# Patient Record
Sex: Male | Born: 1975 | Race: White | Hispanic: No | Marital: Married | Smoking: Current every day smoker
Health system: Southern US, Community
[De-identification: ages and names within clinical notes are randomized; demographics above are authoritative.]

## PROBLEM LIST (undated history)

## (undated) DIAGNOSIS — R200 Anesthesia of skin: Secondary | ICD-10-CM

## (undated) DIAGNOSIS — R202 Paresthesia of skin: Secondary | ICD-10-CM

## (undated) HISTORY — DX: Paresthesia of skin: R20.2

## (undated) HISTORY — DX: Anesthesia of skin: R20.0

## (undated) HISTORY — PX: WISDOM TOOTH EXTRACTION: SHX21

---

## 2019-02-19 ENCOUNTER — Other Ambulatory Visit: Payer: Self-pay

## 2019-02-19 ENCOUNTER — Other Ambulatory Visit: Payer: Self-pay | Admitting: Orthopedic Surgery

## 2019-02-19 ENCOUNTER — Encounter (HOSPITAL_BASED_OUTPATIENT_CLINIC_OR_DEPARTMENT_OTHER): Payer: Self-pay | Admitting: *Deleted

## 2019-02-25 ENCOUNTER — Ambulatory Visit (HOSPITAL_BASED_OUTPATIENT_CLINIC_OR_DEPARTMENT_OTHER)
Admission: RE | Admit: 2019-02-25 | Discharge: 2019-02-25 | Disposition: A | Discharge status: Home / Self Care | Payer: BLUE CROSS/BLUE SHIELD | Attending: Orthopedic Surgery | Admitting: Orthopedic Surgery

## 2019-02-25 ENCOUNTER — Ambulatory Visit (HOSPITAL_BASED_OUTPATIENT_CLINIC_OR_DEPARTMENT_OTHER): Payer: BLUE CROSS/BLUE SHIELD | Admitting: Anesthesiology

## 2019-02-25 ENCOUNTER — Encounter (HOSPITAL_BASED_OUTPATIENT_CLINIC_OR_DEPARTMENT_OTHER)
Admission: RE | Disposition: A | Discharge status: Home / Self Care | Payer: Self-pay | Source: Home / Self Care | Attending: Orthopedic Surgery

## 2019-02-25 ENCOUNTER — Other Ambulatory Visit: Payer: Self-pay

## 2019-02-25 ENCOUNTER — Encounter (HOSPITAL_BASED_OUTPATIENT_CLINIC_OR_DEPARTMENT_OTHER): Payer: Self-pay | Admitting: *Deleted

## 2019-02-25 DIAGNOSIS — L720 Epidermal cyst: Secondary | ICD-10-CM | POA: Insufficient documentation

## 2019-02-25 DIAGNOSIS — F17200 Nicotine dependence, unspecified, uncomplicated: Secondary | ICD-10-CM | POA: Insufficient documentation

## 2019-02-25 DIAGNOSIS — R2232 Localized swelling, mass and lump, left upper limb: Secondary | ICD-10-CM | POA: Diagnosis present

## 2019-02-25 HISTORY — PX: MASS EXCISION: SHX2000

## 2019-02-25 SURGERY — EXCISION MASS
Anesthesia: Regional | Site: Finger | Laterality: Left

## 2019-02-25 MED ORDER — MIDAZOLAM HCL 2 MG/2ML IJ SOLN
1.0000 mg | INTRAMUSCULAR | Status: DC | PRN
  Administered 2019-02-25: 2 mg via INTRAVENOUS

## 2019-02-25 MED ORDER — PROPOFOL 500 MG/50ML IV EMUL
INTRAVENOUS | Status: DC | PRN
  Administered 2019-02-25: 25 ug/kg/min via INTRAVENOUS

## 2019-02-25 MED ORDER — FENTANYL CITRATE (PF) 100 MCG/2ML IJ SOLN
INTRAMUSCULAR | Status: AC
  Filled 2019-02-25: qty 2

## 2019-02-25 MED ORDER — FENTANYL CITRATE (PF) 100 MCG/2ML IJ SOLN
50.0000 ug | INTRAMUSCULAR | Status: DC | PRN
  Administered 2019-02-25: 50 ug via INTRAVENOUS

## 2019-02-25 MED ORDER — FENTANYL CITRATE (PF) 100 MCG/2ML IJ SOLN: 25.0000 ug | INTRAMUSCULAR | Status: DC | PRN

## 2019-02-25 MED ORDER — CHLORHEXIDINE GLUCONATE 4 % EX LIQD: 60.0000 mL | Freq: Once | CUTANEOUS | Status: DC

## 2019-02-25 MED ORDER — OXYCODONE HCL 5 MG/5ML PO SOLN: 5.0000 mg | Freq: Once | ORAL | Status: DC | PRN

## 2019-02-25 MED ORDER — LACTATED RINGERS IV SOLN
INTRAVENOUS | Status: DC
  Administered 2019-02-25: 11:00:00 via INTRAVENOUS

## 2019-02-25 MED ORDER — TRAMADOL HCL 50 MG PO TABS: 50.0000 mg | ORAL_TABLET | Freq: Four times a day (QID) | ORAL | 0 refills | Status: DC | PRN

## 2019-02-25 MED ORDER — CEFAZOLIN SODIUM-DEXTROSE 2-4 GM/100ML-% IV SOLN: 2.0000 g | INTRAVENOUS | Status: DC

## 2019-02-25 MED ORDER — CEFAZOLIN SODIUM-DEXTROSE 2-4 GM/100ML-% IV SOLN
INTRAVENOUS | Status: AC
  Filled 2019-02-25: qty 100

## 2019-02-25 MED ORDER — LIDOCAINE HCL (PF) 0.5 % IJ SOLN
INTRAMUSCULAR | Status: DC | PRN
  Administered 2019-02-25: 30 mL via INTRAVENOUS

## 2019-02-25 MED ORDER — ONDANSETRON HCL 4 MG/2ML IJ SOLN
INTRAMUSCULAR | Status: AC
  Filled 2019-02-25: qty 2

## 2019-02-25 MED ORDER — BUPIVACAINE HCL (PF) 0.25 % IJ SOLN
INTRAMUSCULAR | Status: DC | PRN
  Administered 2019-02-25: 7 mL

## 2019-02-25 MED ORDER — OXYCODONE HCL 5 MG PO TABS: 5.0000 mg | ORAL_TABLET | Freq: Once | ORAL | Status: DC | PRN

## 2019-02-25 MED ORDER — ONDANSETRON HCL 4 MG/2ML IJ SOLN: 4.0000 mg | Freq: Once | INTRAMUSCULAR | Status: DC | PRN

## 2019-02-25 MED ORDER — SCOPOLAMINE 1 MG/3DAYS TD PT72: 1.0000 | MEDICATED_PATCH | Freq: Once | TRANSDERMAL | Status: DC | PRN

## 2019-02-25 MED ORDER — MIDAZOLAM HCL 2 MG/2ML IJ SOLN
INTRAMUSCULAR | Status: AC
  Filled 2019-02-25: qty 2

## 2019-02-25 SURGICAL SUPPLY — 46 items
BLADE SURG 15 STRL LF DISP TIS (BLADE) ×1 IMPLANT
BLADE SURG 15 STRL SS (BLADE) ×2
BNDG COHESIVE 1X5 TAN STRL LF (GAUZE/BANDAGES/DRESSINGS) ×2 IMPLANT
BNDG COHESIVE 2X5 TAN STRL LF (GAUZE/BANDAGES/DRESSINGS) IMPLANT
BNDG COHESIVE 3X5 TAN STRL LF (GAUZE/BANDAGES/DRESSINGS) ×2 IMPLANT
BNDG ESMARK 4X9 LF (GAUZE/BANDAGES/DRESSINGS) ×2 IMPLANT
BNDG GAUZE ELAST 4 BULKY (GAUZE/BANDAGES/DRESSINGS) ×2 IMPLANT
CHLORAPREP W/TINT 26ML (MISCELLANEOUS) ×3 IMPLANT
CORD BIPOLAR FORCEPS 12FT (ELECTRODE) ×1 IMPLANT
COVER BACK TABLE 60X90IN (DRAPES) ×3 IMPLANT
COVER MAYO STAND STRL (DRAPES) ×3 IMPLANT
COVER WAND RF STERILE (DRAPES) IMPLANT
CUFF TOURNIQUET SINGLE 18IN (TOURNIQUET CUFF) ×2 IMPLANT
DECANTER SPIKE VIAL GLASS SM (MISCELLANEOUS) IMPLANT
DRAIN PENROSE 1/2X12 LTX STRL (WOUND CARE) IMPLANT
DRAPE EXTREMITY T 121X128X90 (DISPOSABLE) ×3 IMPLANT
DRAPE SURG 17X23 STRL (DRAPES) ×3 IMPLANT
GAUZE SPONGE 4X4 12PLY STRL (GAUZE/BANDAGES/DRESSINGS) ×3 IMPLANT
GAUZE XEROFORM 1X8 LF (GAUZE/BANDAGES/DRESSINGS) ×3 IMPLANT
GLOVE BIO SURGEON STRL SZ 6.5 (GLOVE) ×1 IMPLANT
GLOVE BIO SURGEONS STRL SZ 6.5 (GLOVE) ×1
GLOVE BIOGEL PI IND STRL 7.0 (GLOVE) IMPLANT
GLOVE BIOGEL PI IND STRL 8.5 (GLOVE) ×1 IMPLANT
GLOVE BIOGEL PI INDICATOR 7.0 (GLOVE) ×2
GLOVE BIOGEL PI INDICATOR 8.5 (GLOVE) ×2
GLOVE SURG ORTHO 8.0 STRL STRW (GLOVE) ×3 IMPLANT
GOWN STRL REUS W/ TWL LRG LVL3 (GOWN DISPOSABLE) ×1 IMPLANT
GOWN STRL REUS W/TWL LRG LVL3 (GOWN DISPOSABLE) ×2
GOWN STRL REUS W/TWL XL LVL3 (GOWN DISPOSABLE) ×3 IMPLANT
NDL PRECISIONGLIDE 27X1.5 (NEEDLE) ×1 IMPLANT
NDL SAFETY ECLIPSE 18X1.5 (NEEDLE) IMPLANT
NEEDLE HYPO 18GX1.5 SHARP (NEEDLE)
NEEDLE PRECISIONGLIDE 27X1.5 (NEEDLE) ×3 IMPLANT
NS IRRIG 1000ML POUR BTL (IV SOLUTION) ×3 IMPLANT
PACK BASIN DAY SURGERY FS (CUSTOM PROCEDURE TRAY) ×3 IMPLANT
PAD CAST 3X4 CTTN HI CHSV (CAST SUPPLIES) IMPLANT
PADDING CAST COTTON 3X4 STRL (CAST SUPPLIES) ×2
SPLINT PLASTER CAST XFAST 3X15 (CAST SUPPLIES) IMPLANT
SPLINT PLASTER XTRA FASTSET 3X (CAST SUPPLIES) ×10
STOCKINETTE 4X48 STRL (DRAPES) ×3 IMPLANT
SUT ETHILON 4 0 PS 2 18 (SUTURE) ×3 IMPLANT
SUT VIC AB 4-0 P2 18 (SUTURE) IMPLANT
SYR BULB 3OZ (MISCELLANEOUS) ×3 IMPLANT
SYR CONTROL 10ML LL (SYRINGE) ×3 IMPLANT
TOWEL GREEN STERILE FF (TOWEL DISPOSABLE) ×3 IMPLANT
UNDERPAD 30X30 (UNDERPADS AND DIAPERS) ×1 IMPLANT

## 2019-02-25 NOTE — Anesthesia Postprocedure Evaluation (Signed)
Anesthesia Post Note  Patient: Sora Urgiles  Procedure(s) Performed: EXCISION MASS LEFT SMALL FINGER (Left Finger)     Patient location during evaluation: PACU Anesthesia Type: Bier Block Level of consciousness: awake and alert Pain management: pain level controlled Vital Signs Assessment: post-procedure vital signs reviewed and stable Respiratory status: spontaneous breathing, nonlabored ventilation and respiratory function stable Cardiovascular status: blood pressure returned to baseline and stable Postop Assessment: no apparent nausea or vomiting Anesthetic complications: no    Last Vitals:  Vitals:   02/25/19 1156 02/25/19 1157  BP:    Pulse: 80 82  Resp: 17 17  Temp:    SpO2: 96% 96%    Last Pain:  Vitals:   02/25/19 1139  TempSrc:   PainSc: 4                  Lucretia Kern

## 2019-02-25 NOTE — Anesthesia Preprocedure Evaluation (Signed)
Anesthesia Evaluation  Patient identified by MRN, date of birth, ID band Patient awake    Reviewed: Allergy & Precautions, NPO status , Patient's Chart, lab work & pertinent test results  History of Anesthesia Complications Negative for: history of anesthetic complications  Airway Mallampati: II  TM Distance: >3 FB Neck ROM: Full    Dental no notable dental hx.    Pulmonary Current Smoker,    Pulmonary exam normal        Cardiovascular negative cardio ROS Normal cardiovascular exam     Neuro/Psych negative neurological ROS  negative psych ROS   GI/Hepatic negative GI ROS, Neg liver ROS,   Endo/Other  negative endocrine ROS  Renal/GU negative Renal ROS  negative genitourinary   Musculoskeletal negative musculoskeletal ROS (+)   Abdominal   Peds  Hematology negative hematology ROS (+)   Anesthesia Other Findings   Reproductive/Obstetrics                             Anesthesia Physical Anesthesia Plan  ASA: II  Anesthesia Plan: Bier Block and Bier Block-LIDOCAINE ONLY   Post-op Pain Management:    Induction:   PONV Risk Score and Plan: 0 and Propofol infusion and Treatment may vary due to age or medical condition  Airway Management Planned: Nasal Cannula and Simple Face Mask  Additional Equipment: None  Intra-op Plan:   Post-operative Plan:   Informed Consent: I have reviewed the patients History and Physical, chart, labs and discussed the procedure including the risks, benefits and alternatives for the proposed anesthesia with the patient or authorized representative who has indicated his/her understanding and acceptance.       Plan Discussed with:   Anesthesia Plan Comments:         Anesthesia Quick Evaluation

## 2019-02-25 NOTE — H&P (Signed)
Clifford Wright is an 43 y.o. male.   Chief Complaint: mass left small finger HPI: Clifford Wright is a 43 year old right-hand-dominant male comes in with a complaint of a mass on his left small finger which is been present for approximately 22 months. He is referred by internal medicine. He has recently moved here from [place]. He had it looked at therapy where they tried to aspirate it without any efficacy any nothing. He was told he had a cyst. He states he is not having any pain with it and has enlarged nodes first being noted. Is on the volar radial aspect of the proximal phalanx at the webspace of his small finger left hand. He has no prior history of injury. He has not complained of any pain or discomfort. It has enlarged. He has no history of diabetes thyroid problems arthritis or gout. History is negative for each of these.   History reviewed. No pertinent past medical history.  Past Surgical History:  Procedure Laterality Date  . WISDOM TOOTH EXTRACTION      History reviewed. No pertinent family history. Social History:  reports that he has been smoking. He has never used smokeless tobacco. He reports that he does not drink alcohol or use drugs.  Allergies: No Known Allergies  No medications prior to admission.    No results found for this or any previous visit (from the past 48 hour(s)).  No results found.   Pertinent items are noted in HPI.  Height 5\' 10"  (1.778 m), weight 79.4 kg.  General appearance: alert, cooperative and appears stated age Head: Normocephalic, without obvious abnormality Neck: no JVD Resp: clear to auscultation bilaterally Cardio: regular rate and rhythm, S1, S2 normal, no murmur, click, rub or gallop GI: soft, non-tender; bowel sounds normal; no masses,  no organomegaly Extremities: mass left small finger Pulses: 2+ and symmetric Skin: Skin color, texture, turgor normal. No rashes or lesions Neurologic: Grossly  normal Incision/Wound: na  Assessment/Plan Assessment:  1. Mass of finger of left hand    Plan: We have discussed surgical excision with him. Pre-peri-and postoperative course are discussed along with risks and complications. He is aware there is no guarantee to the surgery the possibility of infection recurrence injury to arteries nerves tendons complete relief symptoms dystrophy. He is advised that this could always be a either nerve or blood vessel tumor which can require reconstruction with some loss of function following it. His notes reviewed from [facility]. He would like to proceed and is scheduled as an outpatient under regional anesthesia for mass excision left small finger    Cindee Salt 02/25/2019, 8:52 AM

## 2019-02-25 NOTE — Op Note (Signed)
NAME: Clifford Wright MEDICAL RECORD NO: [id number] DATE OF BIRTH: 01/29/1976 FACILITY: [facility] LOCATION: [facility] PHYSICIAN: Nicki Reaper, MD   OPERATIVE REPORT   DATE OF PROCEDURE: 02/25/19    PREOPERATIVE DIAGNOSIS:   Mass left small finger   POSTOPERATIVE DIAGNOSIS:   Same   PROCEDURE:   Excision mass left small finger   SURGEON: Cindee Salt, M.D.   ASSISTANT: none   ANESTHESIA:  Bier block with sedation and Local   INTRAVENOUS FLUIDS:  Per anesthesia flow sheet.   ESTIMATED BLOOD LOSS:  Minimal.   COMPLICATIONS:  None.   SPECIMENS:   Mass   TOURNIQUET TIME:    Total Tourniquet Time Documented: Forearm (Left) - 22 minutes Total: Forearm (Left) - 22 minutes    DISPOSITION:  Stable to PACU.   INDICATIONS: Patient is a 43 year old male with a history of mass on his left small finger that is been present for several years.  This has been gradually enlarging.  He is desirous of having this excised.  It does not transilluminate.  Pre-peri-and postoperative course been discussed along with risks and complications.  He is aware there is no guarantee to the surgery the possibility of infection recurrence injury to arteries nerves tendons complete relief of symptoms and dystrophy.  Preoperative area the patient is seen the extremity marked by both patient and surgeon antibiotic given  OPERATIVE COURSE: Patient is brought to the operating room where form based IV regional anesthetic was carried out without difficulty under the direction the anesthesia department.  He was prepped using ChloraPrep in a supine position with left arm free.  A three-minute dry time was allowed and a timeout taken to confirm patient procedure.  A volar Bruner incision was made over the left small finger palmarly carried down through subcutaneous tissue.  Bleeders were electrocauterized with bipolar.  A mass was immediately encountered.  This was not cystic in nature.  A neurovascular  structures were identified deep to this both proximally and distally.  With blunt sharp dissection the mass was excised.  Small opening was made and a toothpaste type material was immediately extruded.  The cyst was sent to pathology.  This appeared to be an inclusion cyst.  The wound was copiously irrigated with saline.  The skin was closed interrupted 4-0 nylon sutures.  A metacarpal block was given prior to making incisions.  Approximately 6 cc was used.  Sterile compressive dressing to both the ring and small fingers was applied.  The inflation of the tourniquet all fingers immediately pink.  He was taken to the recovery room for observation in satisfactory condition.  He will be discharged home to return the hand center of [facility] in 1 week on Tylenol ibuprofen with Ultram as a breakthrough medicine.   Cindee Salt, MD Electronically signed, 02/25/19

## 2019-02-25 NOTE — Discharge Instructions (Addendum)

## 2019-02-25 NOTE — Transfer of Care (Signed)
Immediate Anesthesia Transfer of Care Note  Patient: Clifford Wright  Procedure(s) Performed: EXCISION MASS LEFT SMALL FINGER (Left Finger)  Patient Location: PACU  Anesthesia Type:MAC and Bier block  Level of Consciousness: awake, alert  and oriented  Airway & Oxygen Therapy: Patient Spontanous Breathing and Patient connected to face mask oxygen  Post-op Assessment: Report given to RN and Post -op Vital signs reviewed and stable  Post vital signs: Reviewed and stable  Last Vitals:  Vitals Value Taken Time  BP    Temp    Pulse 88 02/25/2019 11:38 AM  Resp    SpO2 95 % 02/25/2019 11:38 AM  Vitals shown include unvalidated device data.  Last Pain:  Vitals:   02/25/19 1043  TempSrc: Oral  PainSc: 0-No pain      Patients Stated Pain Goal: 3 (65/03/54 6568)  Complications: No apparent anesthesia complications

## 2019-02-25 NOTE — Brief Op Note (Signed)
02/25/2019  11:37 AM  PATIENT:  Lorenza Chick  43 y.o. male  PRE-OPERATIVE DIAGNOSIS:  MASS LEFT SMALL FINGER  POST-OPERATIVE DIAGNOSIS:  MASS LEFT SMALL FINGER  PROCEDURE:  Procedure(s) with comments: EXCISION MASS LEFT SMALL FINGER (Left) - FAB  SURGEON:  Surgeon(s) and Role:    Cindee Salt, MD - Primary  PHYSICIAN ASSISTANT:   ASSISTANTS: none   ANESTHESIA:   local, regional and IV sedation  EBL:  62ml  BLOOD ADMINISTERED:none  DRAINS: none   LOCAL MEDICATIONS USED:  BUPIVICAINE   SPECIMEN:  Excision  DISPOSITION OF SPECIMEN:  PATHOLOGY  COUNTS:  YES  TOURNIQUET:   Total Tourniquet Time Documented: Forearm (Left) - 22 minutes Total: Forearm (Left) - 22 minutes   DICTATION: .Reubin Milan Dictation  PLAN OF CARE: Discharge to home after PACU  PATIENT DISPOSITION:  PACU - hemodynamically stable.

## 2019-02-26 ENCOUNTER — Encounter (HOSPITAL_BASED_OUTPATIENT_CLINIC_OR_DEPARTMENT_OTHER): Payer: Self-pay | Admitting: Orthopedic Surgery

## 2020-12-03 ENCOUNTER — Encounter: Payer: Self-pay | Admitting: Neurology

## 2020-12-03 ENCOUNTER — Other Ambulatory Visit: Payer: Self-pay

## 2020-12-03 DIAGNOSIS — R202 Paresthesia of skin: Secondary | ICD-10-CM

## 2021-01-18 ENCOUNTER — Encounter: Payer: BLUE CROSS/BLUE SHIELD | Admitting: Neurology

## 2021-04-14 ENCOUNTER — Ambulatory Visit: Payer: BC Managed Care – PPO | Admitting: Neurology

## 2021-04-26 ENCOUNTER — Other Ambulatory Visit: Payer: Self-pay

## 2021-04-26 ENCOUNTER — Ambulatory Visit
Admission: RE | Admit: 2021-04-26 | Discharge: 2021-04-26 | Disposition: A | Discharge status: Home / Self Care | Payer: BC Managed Care – PPO | Source: Ambulatory Visit | Attending: Neurology | Admitting: Neurology

## 2021-04-26 ENCOUNTER — Encounter: Payer: Self-pay | Admitting: Neurology

## 2021-04-26 ENCOUNTER — Ambulatory Visit (INDEPENDENT_AMBULATORY_CARE_PROVIDER_SITE_OTHER): Payer: BC Managed Care – PPO | Admitting: Neurology

## 2021-04-26 VITALS — BP 146/87 | HR 73 | Ht 70.0 in | Wt 199.5 lb

## 2021-04-26 DIAGNOSIS — R202 Paresthesia of skin: Secondary | ICD-10-CM

## 2021-04-26 DIAGNOSIS — G8929 Other chronic pain: Secondary | ICD-10-CM

## 2021-04-26 DIAGNOSIS — M25512 Pain in left shoulder: Secondary | ICD-10-CM

## 2021-04-26 DIAGNOSIS — M542 Cervicalgia: Secondary | ICD-10-CM | POA: Diagnosis not present

## 2021-04-26 MED ORDER — GABAPENTIN 300 MG PO CAPS: 300.0000 mg | ORAL_CAPSULE | Freq: Three times a day (TID) | ORAL | 11 refills | Status: DC

## 2021-04-26 NOTE — Progress Notes (Signed)
Chief Complaint  Patient presents with  . New Patient (Initial Visit)    Reports having numbness and tingling in his left arm. Also, pain in shoulder, arm and elbow. Symptoms started in August 2021. He recently had a normal NCV/EMG. He failed to get relief with Mobic or Celebrex.       ASSESSMENT AND PLAN  Clifford Wright is a 45 y.o. male   Left neck pain, radiating pain to left shoulder, upper extremity, Left shoulder pain  Differentiation diagnosis include left cervical radiculopathy, versus left shoulder pathology  MRI of cervical spine to rule out left cervical radiculopathy  X-ray of left shoulder  Tried and failed ibuprofen, Mobic,  Will start gabapentin 300 mg 3 times daily  DIAGNOSTIC DATA (LABS, IMAGING, TESTING) - I reviewed patient records, labs, notes, testing and imaging myself where available.   HISTORICAL  Clifford Wright is a 46 year old male, seen in request by hand surgeon Dr. Cindee Salt for evaluation of left arm paresthesia, his primary care is [facility], [facility], initial evaluation was on Apr 26, 2021  I reviewed and summarized the referring note.  Patient work as a Administrator, [employer], driving forklift using left hand, rotatory movement in front of his chest few hours each day, he has done similar job over the past 20 years  Since August 2020, he noticed left shoulder deep achy pain, left deltoid region mild pain, especially when he turning his neck to the left side, occasionally radiating pain to left arm, numbness involving all 5 fingers,  Over the past few months, his symptoms gradually getting worse, especially at the end of his working day,   Excision of left small finger mass excision by Dr. Cindee Salt on February 25, 2019  EMG nerve conduction study by Dr. Neale Burly on January 26, 2021, bilateral median sensory, motor, mixed response were normal.  Bilateral ulnar sensory, motor were normal, and bilateral radial motor responses were  normal.  Selective needle examination of bilateral upper extremity muscles and cervical paraspinal muscles were normal.   REVIEW OF SYSTEMS:  Full 14 system review of systems performed and notable only for as above All other review of systems were negative.  PHYSICAL EXAM:   Vitals:   04/26/21 0835  BP: (!) 146/87  Pulse: 73  Weight: 199 lb 8 oz (90.5 kg)  Height: 5\' 10"  (1.778 m)   Not recorded     Body mass index is 28.63 kg/m.  PHYSICAL EXAMNIATION:  Gen: NAD, conversant, well nourised, well groomed                     Cardiovascular: Regular rate rhythm, no peripheral edema, warm, nontender. Eyes: Conjunctivae clear without exudates or hemorrhage Neck: Supple, no carotid bruits. Pulmonary: Clear to auscultation bilaterally   NEUROLOGICAL EXAM:  MENTAL STATUS: Speech:    Speech is normal; fluent and spontaneous with normal comprehension.  Cognition:     Orientation to time, place and person     Normal recent and remote memory     Normal Attention span and concentration     Normal Language, naming, repeating,spontaneous speech     Fund of knowledge   CRANIAL NERVES: CN II: Visual fields are full to confrontation. Pupils are round equal and briskly reactive to light. CN III, IV, VI: extraocular movement are normal. No ptosis. CN V: Facial sensation is intact to light touch CN VII: Face is symmetric with normal eye closure  CN VIII: Hearing is normal to causal  conversation. CN IX, X: Phonation is normal. CN XI: Head turning and shoulder shrug are intact  MOTOR: There is no pronator drift of out-stretched arms. Muscle bulk and tone are normal. Muscle strength is normal.  Tenderness of left anterior shoulder upon deep palpitation, left elbow Tinel's sign  REFLEXES: Reflexes are 2+ and symmetric at the biceps, triceps, knees, and ankles. Plantar responses are flexor.  SENSORY: Intact to light touch, pinprick and vibratory sensation are intact in fingers and  toes.  COORDINATION: There is no trunk or limb dysmetria noted.  GAIT/STANCE: Posture is normal. Gait is steady with normal steps, base, arm swing, and turning. Heel and toe walking are normal. Tandem gait is normal.  Romberg is absent.  ALLERGIES: No Known Allergies  HOME MEDICATIONS: No current outpatient medications on file.   No current facility-administered medications for this visit.    PAST MEDICAL HISTORY: Past Medical History:  Diagnosis Date  . Numbness and tingling in left arm     PAST SURGICAL HISTORY: Past Surgical History:  Procedure Laterality Date  . MASS EXCISION Left 02/25/2019   Procedure: EXCISION MASS LEFT SMALL FINGER;  Surgeon: Cindee Salt, MD;  Location: [facility];  Service: Orthopedics;  Laterality: Left;  FAB  . WISDOM TOOTH EXTRACTION      FAMILY HISTORY: Family History  Adopted: Yes    SOCIAL HISTORY: Social History   Socioeconomic History  . Marital status: Married    Spouse name: Not on file  . Number of children: 3  . Years of education: two years college  . Highest education level: Not on file  Occupational History  . Occupation: Administrator, [employer] - runs a [employer]  Tobacco Use  . Smoking status: Current Every Day Smoker    Packs/day: 0.50    Types: Cigarettes  . Smokeless tobacco: Never Used  Substance and Sexual Activity  . Alcohol use: Not Currently  . Drug use: Never  . Sexual activity: Not on file  Other Topics Concern  . Not on file  Social History Narrative   Lives at home with wife.   Right-handed.   12 ounces caffeine daily.   Social Determinants of Health   Financial Resource Strain: Not on file  Food Insecurity: Not on file  Transportation Needs: Not on file  Physical Activity: Not on file  Stress: Not on file  Social Connections: Not on file  Intimate Partner Violence: Not on file      Levert Feinstein, M.D. Ph.D.  [facility] [address], [address] [place], [place] [address] Ph: [phone] Fax: [phone]  CC:  Cindee Salt, MD [address] [place],  [place] [address]  [place], [place], [place]

## 2021-04-26 NOTE — Patient Instructions (Signed)
[facility]    Address: [address], [place], [place] [address]  Phone: [phone]   

## 2021-04-28 ENCOUNTER — Telehealth: Payer: Self-pay | Admitting: *Deleted

## 2021-04-28 NOTE — Telephone Encounter (Signed)
LVM informing patient her should er x ray is normal. Left # for questions.

## 2021-05-02 ENCOUNTER — Telehealth: Payer: Self-pay | Admitting: Neurology

## 2021-05-02 NOTE — Telephone Encounter (Signed)
LVM on both numbers for pt to call back to schedule [facility] 05/02/21 05/02/21 [employer] auth: NPR Spoke to [place] Ref # [id number] EE

## 2021-05-10 ENCOUNTER — Telehealth: Payer: Self-pay | Admitting: Neurology

## 2021-05-10 NOTE — Telephone Encounter (Signed)
Scheduled with patient over phone for 05/11/21 at [facility]  30 mins MR cervical wo contrast Dr. Mertie Clause NPR ref: [id number]

## 2021-05-10 NOTE — Telephone Encounter (Signed)
Pt called, can someone help me get my MRI scheduled, I have called and left voicemails. Would like to speak with the nurse.

## 2021-05-10 NOTE — Telephone Encounter (Signed)
We have called patient twice and LVM to schedule.

## 2021-05-11 ENCOUNTER — Ambulatory Visit (INDEPENDENT_AMBULATORY_CARE_PROVIDER_SITE_OTHER): Payer: BC Managed Care – PPO

## 2021-05-11 DIAGNOSIS — M542 Cervicalgia: Secondary | ICD-10-CM

## 2021-05-11 DIAGNOSIS — R202 Paresthesia of skin: Secondary | ICD-10-CM

## 2021-05-12 ENCOUNTER — Telehealth: Payer: Self-pay | Admitting: Neurology

## 2021-05-12 ENCOUNTER — Other Ambulatory Visit: Payer: Self-pay | Admitting: *Deleted

## 2021-05-12 DIAGNOSIS — R202 Paresthesia of skin: Secondary | ICD-10-CM

## 2021-05-12 DIAGNOSIS — M25512 Pain in left shoulder: Secondary | ICD-10-CM

## 2021-05-12 DIAGNOSIS — G8929 Other chronic pain: Secondary | ICD-10-CM

## 2021-05-12 DIAGNOSIS — M542 Cervicalgia: Secondary | ICD-10-CM

## 2021-05-12 NOTE — Telephone Encounter (Signed)
I spoke to the patient's wife on DPR. She verbalized understanding of the MRI findings. Reports gabapentin has not been beneficial. She would like to schedule his NCV/EMG. Orders placed in Epic. He has a pending appt for the test on 06/08/21.

## 2021-05-12 NOTE — Telephone Encounter (Signed)
   IMPRESSION:   MRI cervical spine (without) demonstrating: - At C5-6 uncovertebral joint and facet hypertrophy with severe left foraminal stenosis - At C6-7 uncovertebral joint and facet hypertrophy with severe bilateral foraminal stenosis  Please call patient, MRI of cervical spine showed multilevel degenerative changes, most severe at C5-6, C6-7 level, there was no cord compression, or evidence of severe bilateral foraminal stenosis, above findings would explain his complaints of left neck pain, radiating pain to left shoulder  X-ray of left shoulder on Apr 26, 2021 showed no significant abnormality  Please check with him to see if gabapentin has helped his shoulder/neck pain, if he still have significant symptoms, may consider EMG nerve conduction study, review MRIs together,

## 2021-05-13 NOTE — Telephone Encounter (Signed)
Pt called, I know you spoke with my wife about my MRI results, but I am confused. Would like a call from the nurse to clarify results.  Contact info: [phone]

## 2021-05-16 NOTE — Telephone Encounter (Signed)
Left message for patient to call back  

## 2021-06-08 ENCOUNTER — Ambulatory Visit (INDEPENDENT_AMBULATORY_CARE_PROVIDER_SITE_OTHER): Payer: BC Managed Care – PPO | Admitting: Neurology

## 2021-06-08 DIAGNOSIS — G43709 Chronic migraine without aura, not intractable, without status migrainosus: Secondary | ICD-10-CM

## 2021-06-08 DIAGNOSIS — M542 Cervicalgia: Secondary | ICD-10-CM

## 2021-06-08 DIAGNOSIS — M25512 Pain in left shoulder: Secondary | ICD-10-CM | POA: Diagnosis not present

## 2021-06-08 DIAGNOSIS — R202 Paresthesia of skin: Secondary | ICD-10-CM

## 2021-06-08 DIAGNOSIS — G8929 Other chronic pain: Secondary | ICD-10-CM

## 2021-06-08 DIAGNOSIS — M5412 Radiculopathy, cervical region: Secondary | ICD-10-CM | POA: Diagnosis not present

## 2021-06-08 MED ORDER — NORTRIPTYLINE HCL 10 MG PO CAPS: 30.0000 mg | ORAL_CAPSULE | Freq: Every day | ORAL | 11 refills | Status: DC

## 2021-06-08 MED ORDER — SUMATRIPTAN SUCCINATE 50 MG PO TABS: 50.0000 mg | ORAL_TABLET | ORAL | 6 refills | Status: DC | PRN

## 2021-06-08 MED ORDER — MELOXICAM 15 MG PO TABS: 15.0000 mg | ORAL_TABLET | Freq: Every day | ORAL | 6 refills | Status: AC | PRN

## 2021-06-08 NOTE — Patient Instructions (Signed)
Meds ordered this encounter  Medications   nortriptyline (PAMELOR) 10 MG capsule    Sig: Take 3 capsules (30 mg total) by mouth at bedtime.    Dispense:  90 capsule    Refill:  11   SUMAtriptan (IMITREX) 50 MG tablet    Sig: Take 1 tablet (50 mg total) by mouth every 2 (two) hours as needed for migraine. May repeat in 2 hours if headache persists or recurs.    Dispense:  12 tablet    Refill:  6

## 2021-06-08 NOTE — Procedures (Signed)
        Full Name: Clifford Wright Gender: Male MRN #: [id number] Date of Birth: 03-Sep-1976    Visit Date: 06/08/2021 08:02 Age: 45 Years Examining Physician: Levert Feinstein, MD  Referring Physician: Levert Feinstein, MD History: 45 year old right-handed male, complains of worsening neck pain, radiating pain to left shoulder,  Summary of the test:  Nerve conduction study:  Left median, ulnar sensory and motor responses were normal  Electromyography:  Selected needle examination of left upper extremity muscles left cervical paraspinal muscles were performed  There was evidence of mild complex and large motor unit potential at left deltoid, biceps There was also evidence of increased insertional activity at left lower cervical paraspinal muscles, motor unit potential was polyphasic complex.   Conclusion: This is an abnormal study.  There is electrodiagnostic evidence of mild chronic left cervical radiculopathy, mainly involving left C5-C6.    ------------------------------- Levert Feinstein M.D. PhD  [facility] [address] [place], [place] [address] Tel: [phone] Fax: [phone]  Verbal informed consent was obtained from the patient, patient was informed of potential risk of procedure, including bruising, bleeding, hematoma formation, infection, muscle weakness, muscle pain, numbness, among others.        MNC    Nerve / Sites Muscle Latency Ref. Amplitude Ref. Rel Amp Segments Distance Velocity Ref. Area    ms ms mV mV %  cm m/s m/s mVms  L Median - APB     Wrist APB 3.2 ?4.4 11.4 ?4.0 100 Wrist - APB 7   41.9     Upper arm APB 7.2  11.1  97.6 Upper arm - Wrist 23 57 ?49 41.2  L Ulnar - ADM     Wrist ADM 2.5 ?3.3 12.7 ?6.0 100 Wrist - ADM 7   42.0     B.Elbow ADM 6.0  12.0  95 B.Elbow - Wrist 22 63 ?49 39.5     A.Elbow ADM 7.6  11.7  97.3 A.Elbow - B.Elbow 10 61 ?49 38.2         SNC    Nerve / Sites Rec. Site Peak Lat Ref.  Amp Ref. Segments Distance     ms ms V V  cm  L Median - Orthodromic (Dig II, Mid palm)     Dig II Wrist 2.8 ?3.4 16 ?10 Dig II - Wrist 13  L Ulnar - Orthodromic, (Dig V, Mid palm)     Dig V Wrist 2.4 ?3.1 12 ?5 Dig V - Wrist 32         F  Wave    Nerve F Lat Ref.   ms ms  L Ulnar - ADM 27.2 ?32.0       EMG Summary Table    Spontaneous MUAP Recruitment  Muscle IA Fib PSW Fasc Other Amp Dur. Poly Pattern  L. First dorsal interosseous Normal None None None _______ Normal Normal Normal Normal  L. Pronator teres Normal None None None _______ Normal Normal Normal Normal  L. Brachioradialis Normal None None None _______ Normal Normal Normal Normal  L. Biceps brachii Normal None 1+ None _______ Normal Normal Normal Decreased  L. Deltoid Normal None 1+ None _______ Normal Normal Normal Decreased  L. Triceps brachii Normal None None None _______ Normal Normal Normal Normal  L. Extensor digitorum communis Normal None None None _______ Normal Normal Normal Normal  L. Cervical paraspinals Increased None None None _______ Normal Normal Normal Normal

## 2021-06-08 NOTE — Progress Notes (Addendum)
ASSESSMENT AND PLAN  Clifford Wright is a 45 y.o. male   Left cervical radiculopathy  Consistent with his most recent MRI cervical spine findings in May 2022, multilevel degenerative changes, most severe at C5-6, with severe left foraminal narrowing, C6-7, with severe bilateral foraminal stenosis Mobic 15 mg as needed,  He had a short trial of gabapentin 300 mg 3 times daily, did report improvement of pain transiently, did relax him, but does not want to try it again  He works third shift, difficulty sleeping at daytime, will try nortriptyline, titrating to 10 mg 3 tablets every night  He desires pain management refer  Chronic headache  Most consistent with migraine headache  Nortriptyline as preventive medication, Imitrex 50 mg as needed    DIAGNOSTIC DATA (LABS, IMAGING, TESTING) - I reviewed patient records, labs, notes, testing and imaging myself where available.   HISTORICAL  Clifford Wright is a 45 year old male, seen in request by hand surgeon Dr. Cindee Salt for evaluation of left arm paresthesia, his primary care is PA, McKenzie, [employer], initial evaluation was on Apr 26, 2021  I reviewed and summarized the referring note.  Patient work as a Administrator, [employer], driving forklift using left hand, rotatory movement in front of his chest few hours each day, he has done similar job over the past 20 years  Since August 2020, he noticed left shoulder deep achy pain, left deltoid region mild pain, especially when he turning his neck to the left side, occasionally radiating pain to left arm, numbness involving all 5 fingers,  Over the past few months, his symptoms gradually getting worse, especially at the end of his working day,   Excision of left small finger mass excision by Dr. Cindee Salt on February 25, 2019  EMG nerve conduction study by Dr. Neale Burly on January 26, 2021, bilateral median sensory, motor, mixed response were normal.  Bilateral ulnar sensory, motor were normal, and  bilateral radial motor responses were normal.  Selective needle examination of bilateral upper extremity muscles and cervical paraspinal muscles were normal.  Update June 08, 2021: He return for EMG nerve conduction study today, which did show mild left C5-6 radiculopathy, no evidence of active process, Personally reviewed MRI of cervical spine on May 11, 2021, multilevel degenerative changes, most obvious at C5-6, with severe left foraminal stenosis; C6-7, with severe bilateral foraminal stenosis, there was no evidence of canal stenosis  Patient complains of almost persistent, unbearable neck pain, radiating pain to left shoulder, additional trial of gabapentin 300 mg 3 times daily, did relax him, but the benefit was short lasting, he did not want to try it again,  In addition, he also complains of few months history of frequent headaches, left retro-orbital area, bilateral frontal pressure headaches, often exacerbated to a more severe pounding headache, light noise sensitivity, nausea, lasting for few hours, relieved by resting, he noticed trigger of bright light, He somehow suffered severe migraine headaches   REVIEW OF SYSTEMS:  Full 14 system review of systems performed and notable only for as above All other review of systems were negative.  PHYSICAL EXAM:   There were no vitals filed for this visit.  Not recorded     There is no height or weight on file to calculate BMI.  PHYSICAL EXAMNIATION:  Gen: NAD, conversant, well nourised, well groomed       NEUROLOGICAL EXAM:  MENTAL STATUS: Speech:    Speech is normal; fluent and spontaneous with normal comprehension.  Cognition:  Orientation to time, place and person     Normal recent and remote memory     Normal Attention span and concentration     Normal Language, naming, repeating,spontaneous speech     Fund of knowledge   CRANIAL NERVES: CN II: Visual fields are full to confrontation. Pupils are round equal and  briskly reactive to light. CN III, IV, VI: extraocular movement are normal. No ptosis. CN V: Facial sensation is intact to light touch CN VII: Face is symmetric with normal eye closure  CN VIII: Hearing is normal to causal conversation. CN IX, X: Phonation is normal. CN XI: Head turning and shoulder shrug are intact  MOTOR: There is no pronator drift of out-stretched arms. Muscle bulk and tone are normal. Muscle strength is normal.  Tenderness of left anterior shoulder upon deep palpitation, left elbow Tinel's sign  REFLEXES: Reflexes are 2+ and symmetric at the biceps, triceps, knees, and ankles. Plantar responses are flexor.  SENSORY: Intact to light touch, pinprick and vibratory sensation are intact in fingers and toes.  COORDINATION: There is no trunk or limb dysmetria noted.  GAIT/STANCE: Posture is normal. Gait is steady with normal steps, base, arm swing, and turning. Heel and toe walking are normal. Tandem gait is normal.  Romberg is absent.  ALLERGIES: No Known Allergies  HOME MEDICATIONS: Current Outpatient Medications  Medication Sig Dispense Refill   gabapentin (NEURONTIN) 300 MG capsule Take 1 capsule (300 mg total) by mouth 3 (three) times daily. 90 capsule 11   No current facility-administered medications for this visit.    PAST MEDICAL HISTORY: Past Medical History:  Diagnosis Date   Numbness and tingling in left arm     PAST SURGICAL HISTORY: Past Surgical History:  Procedure Laterality Date   MASS EXCISION Left 02/25/2019   Procedure: EXCISION MASS LEFT SMALL FINGER;  Surgeon: Cindee Salt, MD;  Location: [facility];  Service: Orthopedics;  Laterality: Left;  FAB   WISDOM TOOTH EXTRACTION      FAMILY HISTORY: Family History  Adopted: Yes    SOCIAL HISTORY: Social History   Socioeconomic History   Marital status: Married    Spouse name: Not on file   Number of children: 3   Years of education: two years college   Highest  education level: Not on file  Occupational History   Occupation: Administrator, [employer] - runs a [employer]  Tobacco Use   Smoking status: Every Day    Packs/day: 0.50    Pack years: 0.00    Types: Cigarettes   Smokeless tobacco: Never  Substance and Sexual Activity   Alcohol use: Not Currently   Drug use: Never   Sexual activity: Not on file  Other Topics Concern   Not on file  Social History Narrative   Lives at home with wife.   Right-handed.   12 ounces caffeine daily.   Social Determinants of Health   Financial Resource Strain: Not on file  Food Insecurity: Not on file  Transportation Needs: Not on file  Physical Activity: Not on file  Stress: Not on file  Social Connections: Not on file  Intimate Partner Violence: Not on file    Addendum: Neurosurgical referral by nurse practitioner Rulon Abide cervical steroid injection directed C7-T1  Levert Feinstein, M.D. Ph.D.  [facility] [address] [place], [place] [address] Ph: [phone] Fax: [phone]  CC:  McKenzie, Ovid, [address] [place],  [place] [address]  McKenzie, Bradley  Casimiro Needle, PA-C

## 2021-06-13 ENCOUNTER — Telehealth: Payer: Self-pay

## 2021-06-13 NOTE — Telephone Encounter (Signed)
Referral for pain management sent to [facility]. P: [id number].

## 2021-06-30 ENCOUNTER — Other Ambulatory Visit: Payer: Self-pay | Admitting: *Deleted

## 2021-06-30 MED ORDER — SUMATRIPTAN SUCCINATE 50 MG PO TABS: ORAL_TABLET | ORAL | 6 refills | Status: DC

## 2021-07-10 IMAGING — CR DG SHOULDER 2+V*L*
3 series · 3 of 3 positions shown · non-contrast
Comparison: None.

CLINICAL DATA: Left shoulder pain for 9 months. Left hand
paresthesia. Neck pain.

EXAM:
LEFT SHOULDER - 2+ VIEW

[w shoulder grashey left]
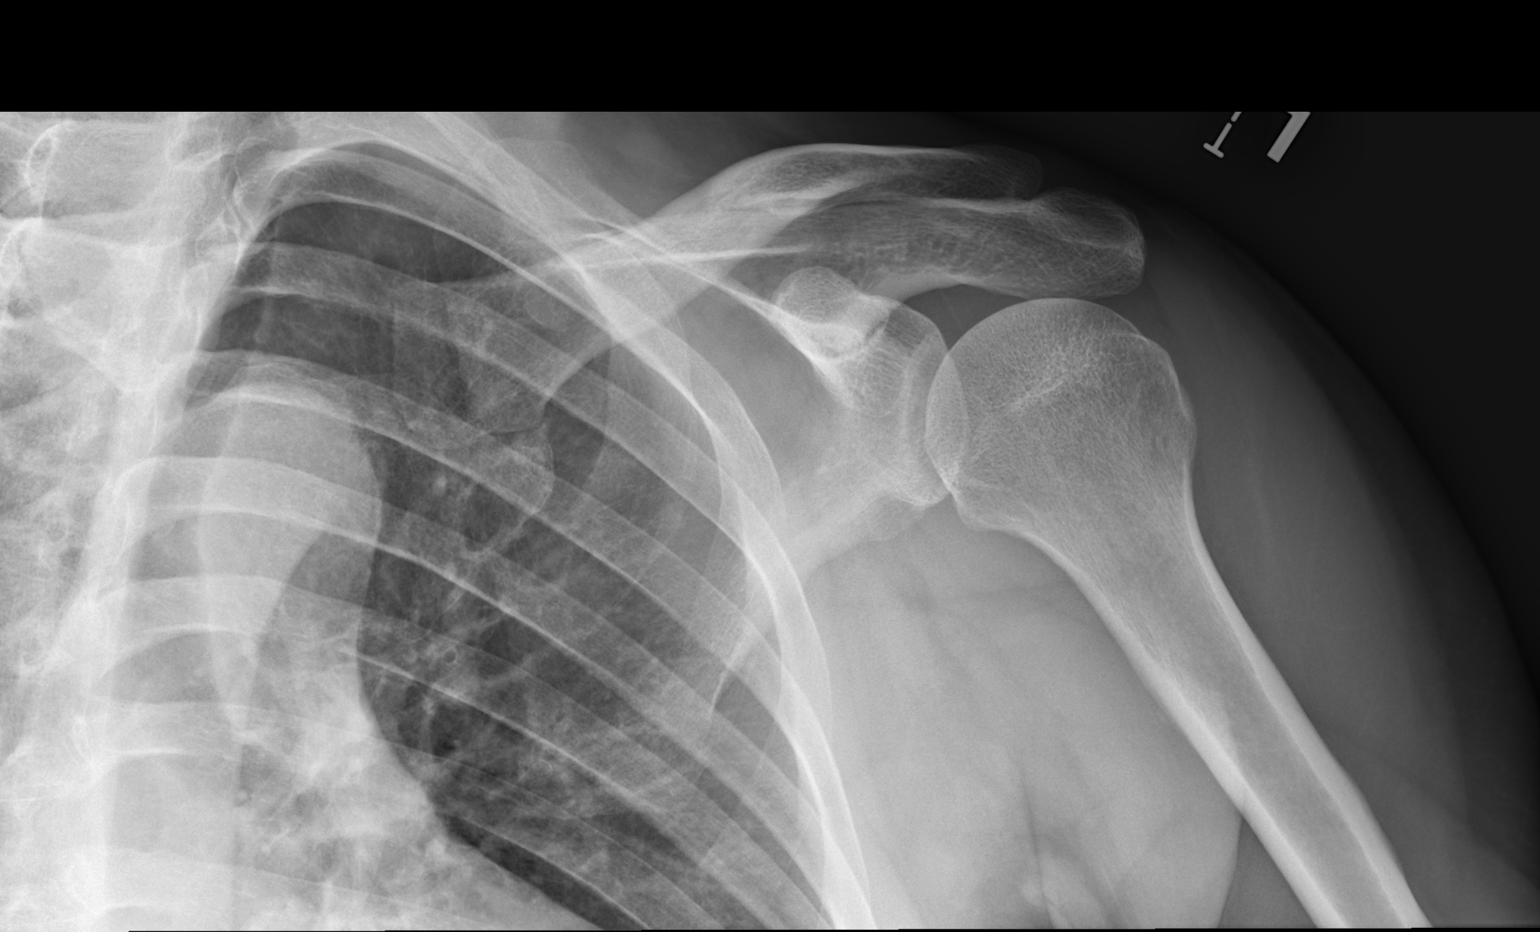

[w shoulder y-view left]
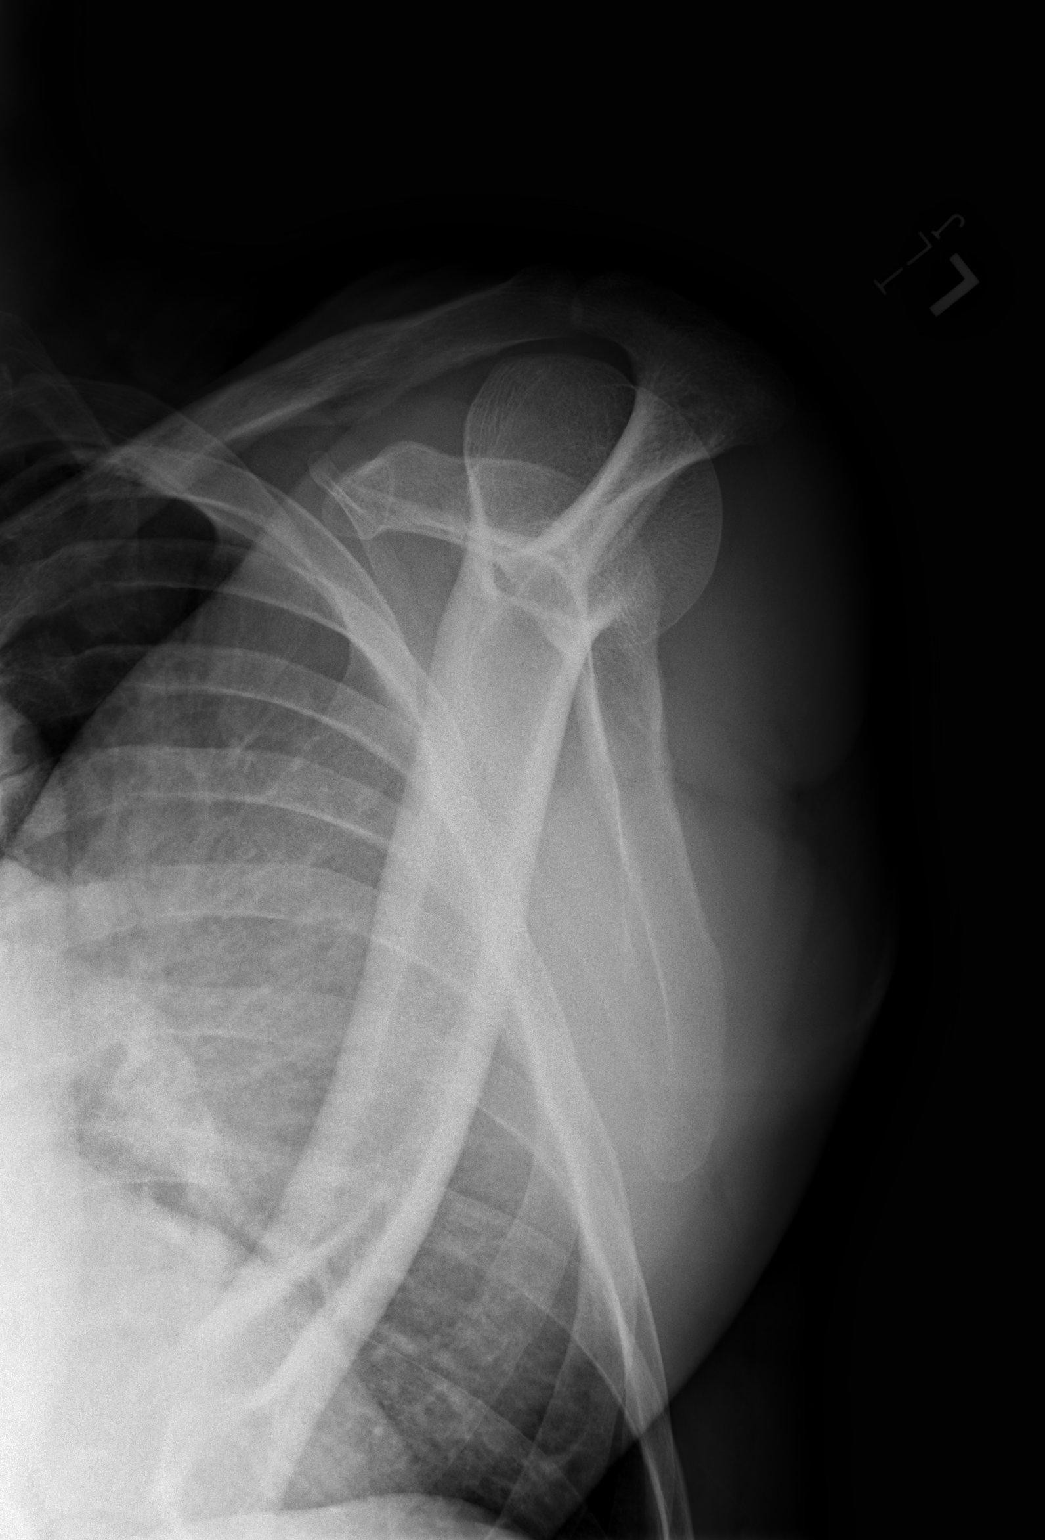

[w shoulder axillary left]
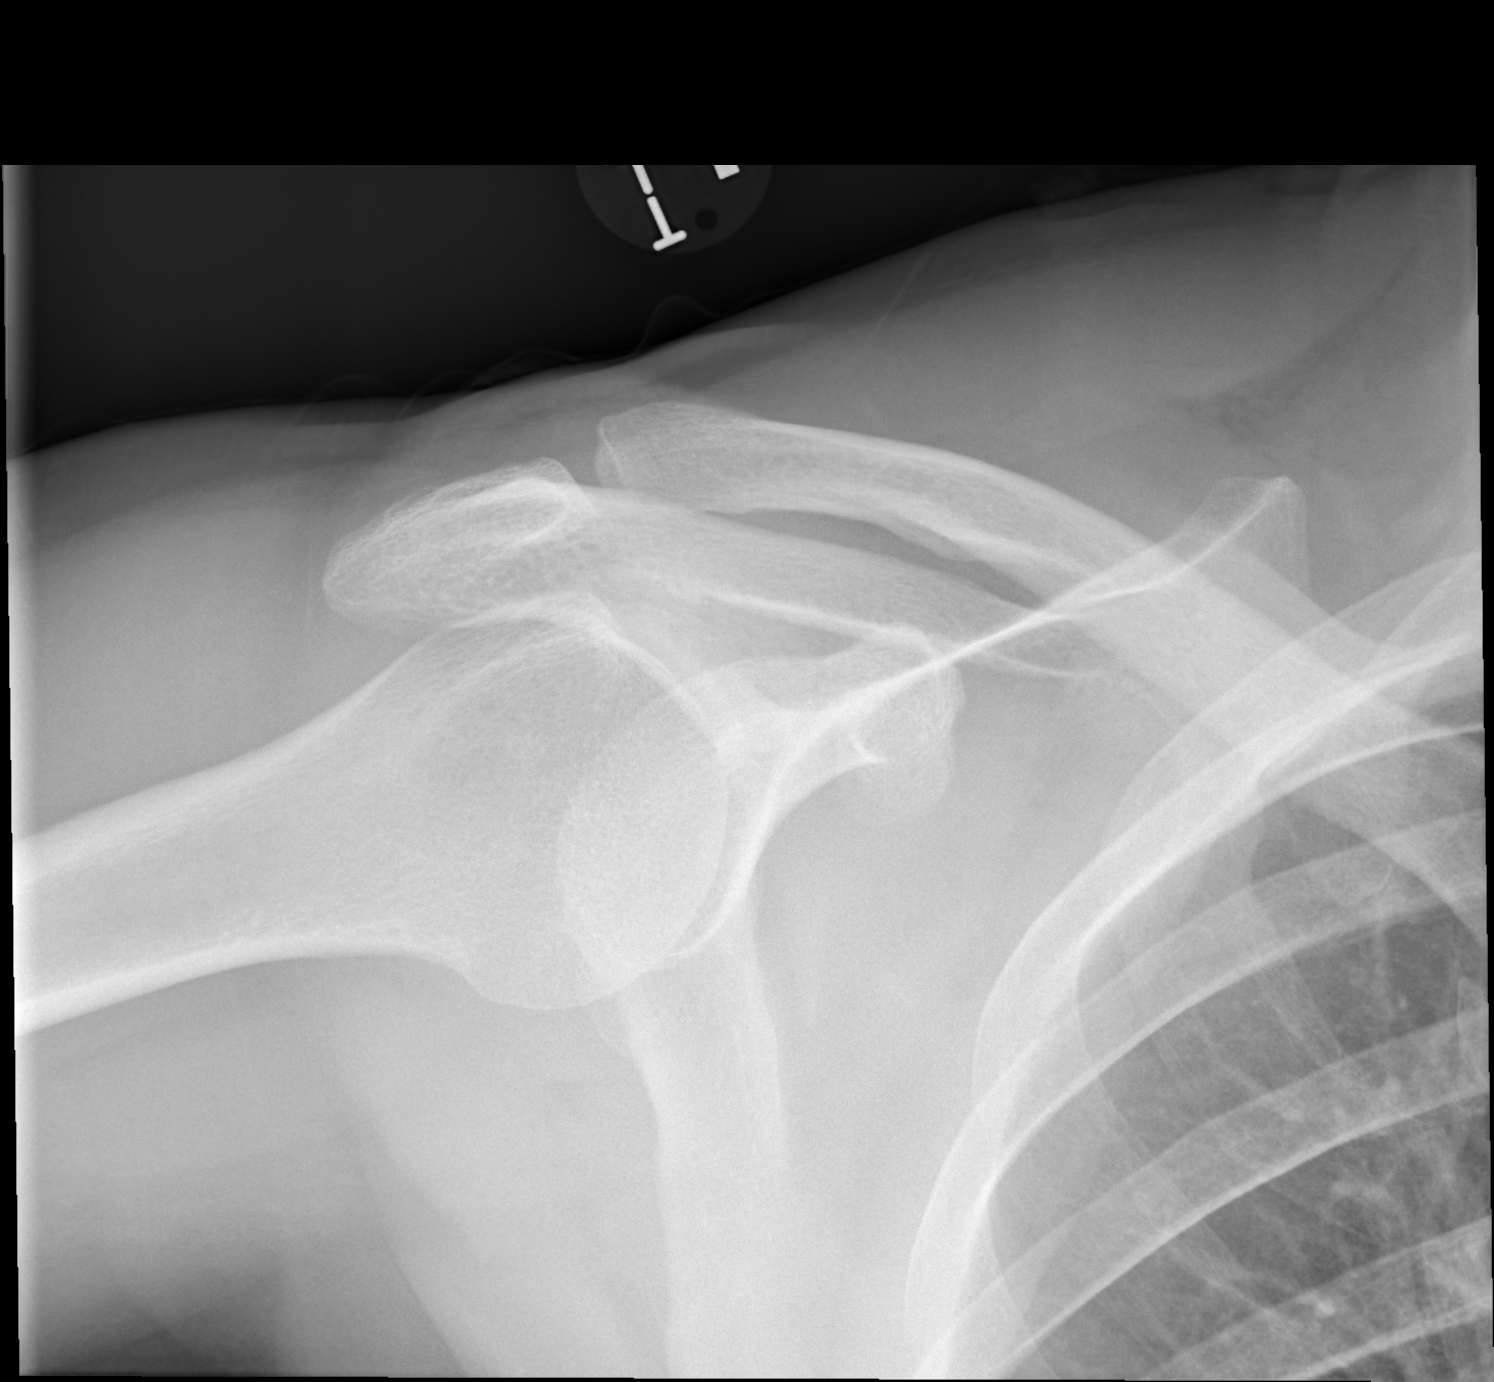

[3 of 3 positions shown; findings below may reference images not displayed]

FINDINGS: There is no evidence of fracture or dislocation. Normal alignment
and joint spaces. There is no evidence of arthropathy or other focal
bone abnormality. Soft tissues are unremarkable.
IMPRESSION: Negative radiographs of the left shoulder.

## 2021-12-18 ENCOUNTER — Other Ambulatory Visit: Payer: Self-pay | Admitting: Neurology

## 2022-01-13 ENCOUNTER — Encounter: Payer: Self-pay | Admitting: Neurology

## 2022-01-18 ENCOUNTER — Encounter: Payer: Self-pay | Admitting: Neurology

## 2022-01-18 ENCOUNTER — Ambulatory Visit (INDEPENDENT_AMBULATORY_CARE_PROVIDER_SITE_OTHER): Payer: BC Managed Care – PPO | Admitting: Neurology

## 2022-01-18 VITALS — BP 143/82 | HR 90 | Ht 70.0 in | Wt 190.5 lb

## 2022-01-18 DIAGNOSIS — M542 Cervicalgia: Secondary | ICD-10-CM

## 2022-01-18 DIAGNOSIS — F5104 Psychophysiologic insomnia: Secondary | ICD-10-CM | POA: Diagnosis not present

## 2022-01-18 DIAGNOSIS — G4452 New daily persistent headache (NDPH): Secondary | ICD-10-CM | POA: Diagnosis not present

## 2022-01-18 MED ORDER — RIZATRIPTAN BENZOATE 10 MG PO TBDP: ORAL_TABLET | ORAL | 6 refills | Status: AC

## 2022-01-18 MED ORDER — AIMOVIG 70 MG/ML ~~LOC~~ SOAJ: 70.0000 mg | SUBCUTANEOUS | 11 refills | Status: AC

## 2022-01-18 MED ORDER — DULOXETINE HCL 60 MG PO CPEP: 60.0000 mg | ORAL_CAPSULE | Freq: Every day | ORAL | 11 refills | Status: DC

## 2022-01-18 MED ORDER — CYCLOBENZAPRINE HCL 5 MG PO TABS: 5.0000 mg | ORAL_TABLET | Freq: Three times a day (TID) | ORAL | 1 refills | Status: AC | PRN

## 2022-01-18 NOTE — Progress Notes (Signed)
ASSESSMENT AND PLAN  Clifford Wright is a 46 y.o. male   Left cervical radicular pain  Consistent with his most recent MRI cervical spine findings in May 2022, multilevel degenerative changes, most severe at C5-6, with severe left foraminal narrowing, C6-7, with severe bilateral foraminal stenosis Mobic 15 mg as needed,  He had a short trial of gabapentin 300 mg 3 times daily, did report improvement of pain transiently, did relax him, but does not want to try it again  He works third shift, difficulty sleeping at daytime, previously was given nortriptyline, did not help him,  Will try Cymbalta,    Chronic headache  Most consistent with migraine headache,  Suboptimal response to Imitrex, will try Maxalt as needed  Reported recent onset loss sense of smell, is not COVID-vaccine,  Check SARS antibody    DIAGNOSTIC DATA (LABS, IMAGING, TESTING) - I reviewed patient records, labs, notes, testing and imaging myself where available.   HISTORICAL  Clifford Wright is a 46 year old male, seen in request by hand surgeon Dr. Daryll Brod for evaluation of left arm paresthesia, his primary care is PA, McKenzie, [employer], initial evaluation was on Apr 26, 2021  I reviewed and summarized the referring note.  Patient work as a Associate Professor, driving forklift using left hand, rotatory movement in front of his chest few hours each day, he has done similar job over the past 20 years  Since August 2020, he noticed left shoulder deep achy pain, left deltoid region mild pain, especially when he turning his neck to the left side, occasionally radiating pain to left arm, numbness involving all 5 fingers,  Over the past few months, his symptoms gradually getting worse, especially at the end of his working day,  Excision of left small finger mass excision by Dr. Daryll Brod on February 25, 2019  EMG nerve conduction study by Dr. Domingo Cocking on January 26, 2021, bilateral median sensory, motor, mixed response  were normal.  Bilateral ulnar sensory, motor were normal, and bilateral radial motor responses were normal.  Selective needle examination of bilateral upper extremity muscles and cervical paraspinal muscles were normal.  Update June 08, 2021: He return for EMG nerve conduction study today, which did show mild left C5-6 radiculopathy, no evidence of active process, Personally reviewed MRI of cervical spine on May 11, 2021, multilevel degenerative changes, most obvious at C5-6, with severe left foraminal stenosis; C6-7, with severe bilateral foraminal stenosis, there was no evidence of canal stenosis  Patient complains of almost persistent, unbearable neck pain, radiating pain to left shoulder, additional trial of gabapentin 300 mg 3 times daily, did relax him, but the benefit was short lasting, he did not want to try it again,  In addition, he also complains of few months history of frequent headaches, left retro-orbital area, bilateral frontal pressure headaches, often exacerbated to a more severe pounding headache, light noise sensitivity, nausea, lasting for few hours, relieved by resting, he noticed trigger of bright light, He somehow suffered severe migraine headaches  UPDATE Jan 18 2022: He was seen by neurosurgeon clinic,  had 1st cervical epidural injection in June 2022, lost his sense of smell, reported injection did not help him;  2nd injection on left side in Sept 2022, did help him some;  3rd on Dec 15th 2022,  did not help him.  Now he has constant pain starting from upper cervical radiating to occipital region, especially in upright position, light sensitivity, now he has more right arm discomfort, right  toe paresthesia,    He also complains of constant headache, daily, throbbing, difficulty sleeping, fatigue, sleeping.  Reported that he has failed multiple over-the-counter medications, even the prescription nortriptyline, he is no longer taking, Cymbalta, Flexeril, meloxicam,  triptan, want opiates treatment,  REVIEW OF SYSTEMS:  Full 14 system review of systems performed and notable only for as above All other review of systems were negative.  PHYSICAL EXAM:   Vitals:   01/18/22 1139  BP: (!) 143/82  Pulse: 90  Weight: 190 lb 8 oz (86.4 kg)  Height: 5\' 10"  (1.778 m)    Not recorded     Body mass index is 27.33 kg/m.  PHYSICAL EXAMNIATION:  Gen: NAD, conversant, well nourised, well groomed       NEUROLOGICAL EXAM:  MENTAL STATUS: Speech cognition: Awake, alert,: Oriented to history taking and care conversation,   CRANIAL NERVES: CN II: Visual fields are full to confrontation. Pupils are round equal and briskly reactive to light. CN III, IV, VI: extraocular movement are normal. No ptosis. CN V: Facial sensation is intact to light touch CN VII: Face is symmetric with normal eye closure  CN VIII: Hearing is normal to causal conversation. CN IX, X: Phonation is normal. CN XI: Head turning and shoulder shrug are intact  MOTOR: There is no pronator drift of out-stretched arms. Muscle bulk and tone are normal. Muscle strength is normal.  Tenderness of left anterior shoulder upon deep palpitation, left elbow Tinel's sign  REFLEXES: Reflexes are 2+ and symmetric at the biceps, triceps, knees, and ankles. Plantar responses are flexor.  SENSORY: Intact to light touch, pinprick and vibratory sensation are intact in fingers and toes.  COORDINATION: There is no trunk or limb dysmetria noted.  GAIT/STANCE: Posture is normal. Gait is steady with normal steps, base, arm swing, and turning. Heel and toe walking are normal. Tandem gait is normal.  Romberg is absent.  ALLERGIES: No Known Allergies  HOME MEDICATIONS: Current Outpatient Medications  Medication Sig Dispense Refill   meloxicam (MOBIC) 15 MG tablet Take 1 tablet (15 mg total) by mouth daily as needed for pain. 30 tablet 6   SUMAtriptan (IMITREX) 50 MG tablet Take 1 tab at onset of  migraine.  May repeat in 2 hrs, if needed.  Max dose: 2 tabs/day. This is a 30 day prescription. 9 tablet 6   No current facility-administered medications for this visit.    PAST MEDICAL HISTORY: Past Medical History:  Diagnosis Date   Numbness and tingling in left arm     PAST SURGICAL HISTORY: Past Surgical History:  Procedure Laterality Date   MASS EXCISION Left 02/25/2019   Procedure: EXCISION MASS LEFT SMALL FINGER;  Surgeon: Daryll Brod, MD;  Location: [facility];  Service: Orthopedics;  Laterality: Left;  FAB   WISDOM TOOTH EXTRACTION      FAMILY HISTORY: Family History  Adopted: Yes    SOCIAL HISTORY: Social History   Socioeconomic History   Marital status: Married    Spouse name: Not on file   Number of children: 3   Years of education: two years college   Highest education level: Not on file  Occupational History   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   Right-handed.   12 ounces caffeine daily.   Social Determinants of Health   Financial Resource Strain: Not on file  Food Insecurity: Not on file  Transportation Needs: Not on file  Physical Activity: Not on file  Stress: Not on file  Social Connections: Not on file  Intimate Partner Violence: Not on file   Total time spent reviewing the chart, obtaining history, examined patient, ordering tests, documentation, consultations and family, care coordination was 71 minutes   Marcial Pacas, M.D. Ph.D.  [facility] [address] [place], [place] [address] Ph: [phone] Fax: [phone]  CC:  McKenzie, Yetta Numbers, [place],  [place] [address]  McKenzie, Yetta Numbers, PA-C

## 2022-01-19 ENCOUNTER — Encounter: Payer: Self-pay | Admitting: Neurology

## 2022-01-19 LAB — CBC WITH DIFFERENTIAL
Basophils Absolute: 0 10*3/uL (ref 0.0–0.2)
Basos: 0 %
EOS (ABSOLUTE): 0.3 10*3/uL (ref 0.0–0.4)
Eos: 4 %
Hematocrit: 42.1 % (ref 37.5–51.0)
Hemoglobin: 14.2 g/dL (ref 13.0–17.7)
Immature Grans (Abs): 0 10*3/uL (ref 0.0–0.1)
Immature Granulocytes: 0 %
Lymphocytes Absolute: 4.1 10*3/uL — ABNORMAL HIGH (ref 0.7–3.1)
Lymphs: 46 %
MCH: 31 pg (ref 26.6–33.0)
MCHC: 33.7 g/dL (ref 31.5–35.7)
MCV: 92 fL (ref 79–97)
Monocytes Absolute: 0.7 10*3/uL (ref 0.1–0.9)
Monocytes: 8 %
Neutrophils Absolute: 3.7 10*3/uL (ref 1.4–7.0)
Neutrophils: 42 %
RBC: 4.58 x10E6/uL (ref 4.14–5.80)
RDW: 12.5 % (ref 11.6–15.4)
WBC: 8.9 10*3/uL (ref 3.4–10.8)

## 2022-01-19 LAB — COMPREHENSIVE METABOLIC PANEL WITH GFR
ALT: 18 IU/L (ref 0–44)
AST: 14 IU/L (ref 0–40)
Albumin/Globulin Ratio: 2.3 — ABNORMAL HIGH (ref 1.2–2.2)
Albumin: 4.6 g/dL (ref 4.0–5.0)
Alkaline Phosphatase: 98 IU/L (ref 44–121)
BUN/Creatinine Ratio: 9 (ref 9–20)
BUN: 9 mg/dL (ref 6–24)
Bilirubin Total: 0.3 mg/dL (ref 0.0–1.2)
CO2: 25 mmol/L (ref 20–29)
Calcium: 9.5 mg/dL (ref 8.7–10.2)
Chloride: 103 mmol/L (ref 96–106)
Creatinine, Ser: 0.96 mg/dL (ref 0.76–1.27)
Globulin, Total: 2 g/dL (ref 1.5–4.5)
Glucose: 86 mg/dL (ref 70–99)
Potassium: 4.3 mmol/L (ref 3.5–5.2)
Sodium: 143 mmol/L (ref 134–144)
Total Protein: 6.6 g/dL (ref 6.0–8.5)
eGFR: 98 mL/min/1.73

## 2022-01-19 LAB — C-REACTIVE PROTEIN: CRP: 4 mg/L (ref 0–10)

## 2022-01-19 LAB — SYPHILIS: RPR W/REFLEX TO RPR TITER AND TREPONEMAL ANTIBODIES, TRADITIONAL SCREENING AND DIAGNOSIS ALGORITHM: RPR Ser Ql: NONREACTIVE

## 2022-01-19 LAB — SARS-COV-2 ANTIBODIES: SARS-CoV-2 Antibodies: POSITIVE

## 2022-01-19 LAB — TSH: TSH: 3.35 u[IU]/mL (ref 0.450–4.500)

## 2022-01-19 LAB — SEDIMENTATION RATE: Sed Rate: 5 mm/h (ref 0–15)

## 2022-01-19 LAB — VITAMIN B12: Vitamin B-12: 377 pg/mL (ref 232–1245)

## 2022-01-19 NOTE — Telephone Encounter (Signed)
I called the patient. Reviewed lab results and answered all questions.

## 2022-01-21 ENCOUNTER — Other Ambulatory Visit: Payer: Self-pay | Admitting: Neurology

## 2022-01-25 ENCOUNTER — Telehealth: Payer: Self-pay | Admitting: Neurology

## 2022-01-25 NOTE — Telephone Encounter (Signed)
MR Brain w/wo contrast Dr. Mertie Clause auth: [employer] Ref # [id number]. Patient is scheduled at [facility] for 02/01/22

## 2022-02-01 ENCOUNTER — Ambulatory Visit: Payer: BC Managed Care – PPO

## 2022-02-01 DIAGNOSIS — G4452 New daily persistent headache (NDPH): Secondary | ICD-10-CM | POA: Diagnosis not present

## 2022-02-01 MED ORDER — GADOBENATE DIMEGLUMINE 529 MG/ML IV SOLN
15.0000 mL | Freq: Once | INTRAVENOUS | Status: AC | PRN
  Administered 2022-02-01: 15 mL via INTRAVENOUS

## 2022-02-06 ENCOUNTER — Telehealth: Payer: Self-pay | Admitting: Neurology

## 2022-02-06 NOTE — Telephone Encounter (Signed)
Lvm for pt to call us back.

## 2022-02-06 NOTE — Telephone Encounter (Signed)
IMPRESSION:    MRI brain (with and without) demonstrating: - Right frontal subcortical DWI hyperintensity (63mm), hyperintense on T2 flair. No abnormal enhancement. These findings are non-specific and considerations include autoimmune, inflammatory, post-infectious, or  microvascular ischemic etiologies.  Please call patient, MRI of the brain showed mild small vessel disease, nonspecific acute change at right frontal subcortical region, only 7 mm, differentiation diagnosis include postinfectious, inflammation, versus microvascular ischemic change,  But above findings would not explain his headache, his headache is most consistent with chronic migraine

## 2022-02-07 NOTE — Telephone Encounter (Signed)
The options for the treatment of his neck pain including  Combination of Cymbalta, plus Mobic, May consider add on gabapentin or Lyrica  refer to pain management

## 2022-02-07 NOTE — Telephone Encounter (Signed)
Results given to pt.  States he is not concerned about mri results, would like to discuss treatment plan for neck pain, states Cymbalta provides some relief.

## 2022-02-08 NOTE — Telephone Encounter (Signed)
Relayed message to wife, pt will call us back when wakes up.

## 2022-02-09 ENCOUNTER — Other Ambulatory Visit: Payer: Self-pay | Admitting: Neurology

## 2022-02-09 ENCOUNTER — Ambulatory Visit: Payer: BC Managed Care – PPO | Admitting: Neurology

## 2022-02-10 ENCOUNTER — Other Ambulatory Visit: Payer: Self-pay | Admitting: *Deleted

## 2022-03-14 ENCOUNTER — Encounter: Payer: Self-pay | Admitting: Neurology

## 2022-03-14 ENCOUNTER — Encounter: Payer: Self-pay | Admitting: Adult Health

## 2022-03-15 NOTE — Telephone Encounter (Signed)
Duplicate message. Response sent from Dr. Terrace Arabia. ?

## 2022-04-29 ENCOUNTER — Other Ambulatory Visit: Payer: Self-pay | Admitting: Neurology

## 2022-05-23 ENCOUNTER — Encounter: Payer: Self-pay | Admitting: Adult Health

## 2022-06-05 ENCOUNTER — Telehealth: Payer: Self-pay | Admitting: Neurology

## 2022-06-05 DIAGNOSIS — M542 Cervicalgia: Secondary | ICD-10-CM

## 2022-06-05 DIAGNOSIS — G4452 New daily persistent headache (NDPH): Secondary | ICD-10-CM

## 2022-06-05 DIAGNOSIS — F5104 Psychophysiologic insomnia: Secondary | ICD-10-CM

## 2022-06-05 NOTE — Telephone Encounter (Signed)
Orders Placed This Encounter  Procedures   Ambulatory referral to Neurology     [facility]

## 2022-06-06 ENCOUNTER — Telehealth: Payer: Self-pay | Admitting: Neurology

## 2022-06-06 NOTE — Telephone Encounter (Signed)
Referral for Neurology sent to [facility] [phone]. 

## 2022-07-19 ENCOUNTER — Ambulatory Visit: Payer: BC Managed Care – PPO | Admitting: Adult Health

## 2022-07-24 NOTE — Telephone Encounter (Signed)
Pt called [facility], was informed have not received referral. [facility] ask referral  refax to [phone]

## 2022-07-25 NOTE — Telephone Encounter (Signed)
I resent the referral to [facility] to the provided fax number.
